# Patient Record
Sex: Female | Born: 1995 | Race: Black or African American | Hispanic: No | Marital: Single | Smoking: Never smoker
Health system: Southern US, Community
[De-identification: ages and names within clinical notes are randomized; demographics above are authoritative.]

## PROBLEM LIST (undated history)

## (undated) DIAGNOSIS — J42 Unspecified chronic bronchitis: Secondary | ICD-10-CM

## (undated) DIAGNOSIS — J45909 Unspecified asthma, uncomplicated: Secondary | ICD-10-CM

## (undated) DIAGNOSIS — J3089 Other allergic rhinitis: Secondary | ICD-10-CM

## (undated) HISTORY — DX: Other allergic rhinitis: J30.89

---
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
## 2015-01-22 ENCOUNTER — Encounter (HOSPITAL_COMMUNITY): Payer: Self-pay | Admitting: *Deleted

## 2015-01-22 ENCOUNTER — Emergency Department (HOSPITAL_COMMUNITY)
Admission: EM | Admit: 2015-01-22 | Discharge: 2015-01-22 | Disposition: A | Discharge status: Home / Self Care | Payer: Medicaid Other | Attending: Emergency Medicine | Admitting: Emergency Medicine

## 2015-01-22 DIAGNOSIS — R112 Nausea with vomiting, unspecified: Secondary | ICD-10-CM | POA: Insufficient documentation

## 2015-01-22 DIAGNOSIS — Z7951 Long term (current) use of inhaled steroids: Secondary | ICD-10-CM | POA: Diagnosis not present

## 2015-01-22 DIAGNOSIS — Z880 Allergy status to penicillin: Secondary | ICD-10-CM | POA: Insufficient documentation

## 2015-01-22 DIAGNOSIS — A599 Trichomoniasis, unspecified: Secondary | ICD-10-CM | POA: Diagnosis not present

## 2015-01-22 DIAGNOSIS — Z79899 Other long term (current) drug therapy: Secondary | ICD-10-CM | POA: Insufficient documentation

## 2015-01-22 DIAGNOSIS — Z3202 Encounter for pregnancy test, result negative: Secondary | ICD-10-CM | POA: Diagnosis not present

## 2015-01-22 DIAGNOSIS — J45909 Unspecified asthma, uncomplicated: Secondary | ICD-10-CM | POA: Insufficient documentation

## 2015-01-22 DIAGNOSIS — R079 Chest pain, unspecified: Secondary | ICD-10-CM | POA: Insufficient documentation

## 2015-01-22 DIAGNOSIS — R05 Cough: Secondary | ICD-10-CM | POA: Diagnosis present

## 2015-01-22 HISTORY — DX: Unspecified chronic bronchitis: J42

## 2015-01-22 LAB — CBC WITH DIFFERENTIAL/PLATELET
Basophils Absolute: 0 10*3/uL (ref 0.0–0.1)
Basophils Relative: 0 % (ref 0–1)
Eosinophils Absolute: 0.1 10*3/uL (ref 0.0–0.7)
Eosinophils Relative: 1 % (ref 0–5)
HEMATOCRIT: 31.1 % — AB (ref 36.0–46.0)
Hemoglobin: 9.7 g/dL — ABNORMAL LOW (ref 12.0–15.0)
LYMPHS ABS: 4.5 10*3/uL — AB (ref 0.7–4.0)
Lymphocytes Relative: 44 % (ref 12–46)
MCH: 23 pg — AB (ref 26.0–34.0)
MCHC: 31.2 g/dL (ref 30.0–36.0)
MCV: 73.7 fL — ABNORMAL LOW (ref 78.0–100.0)
Monocytes Absolute: 1 10*3/uL (ref 0.1–1.0)
Monocytes Relative: 10 % (ref 3–12)
NEUTROS ABS: 4.6 10*3/uL (ref 1.7–7.7)
NEUTROS PCT: 45 % (ref 43–77)
Platelets: 294 10*3/uL (ref 150–400)
RBC: 4.22 MIL/uL (ref 3.87–5.11)
RDW: 19 % — ABNORMAL HIGH (ref 11.5–15.5)
WBC: 10.2 10*3/uL (ref 4.0–10.5)

## 2015-01-22 LAB — COMPREHENSIVE METABOLIC PANEL
ALBUMIN: 3 g/dL — AB (ref 3.5–5.2)
ALT: 10 U/L (ref 0–35)
ANION GAP: 7 (ref 5–15)
AST: 18 U/L (ref 0–37)
Alkaline Phosphatase: 74 U/L (ref 39–117)
BUN: 10 mg/dL (ref 6–23)
CHLORIDE: 107 mmol/L (ref 96–112)
CO2: 26 mmol/L (ref 19–32)
CREATININE: 0.82 mg/dL (ref 0.50–1.10)
Calcium: 8.7 mg/dL (ref 8.4–10.5)
GFR calc non Af Amer: >90 mL/min (ref >90)
Glucose, Bld: 79 mg/dL (ref 70–99)
Potassium: 3.6 mmol/L (ref 3.5–5.1)
SODIUM: 140 mmol/L (ref 135–145)
Total Bilirubin: 0.3 mg/dL (ref 0.3–1.2)
Total Protein: 6.6 g/dL (ref 6.0–8.3)

## 2015-01-22 LAB — URINE MICROSCOPIC-ADD ON

## 2015-01-22 LAB — URINALYSIS, ROUTINE W REFLEX MICROSCOPIC
Bilirubin Urine: NEGATIVE
Glucose, UA: NEGATIVE mg/dL
Ketones, ur: NEGATIVE mg/dL
NITRITE: NEGATIVE
PROTEIN: NEGATIVE mg/dL
SPECIFIC GRAVITY, URINE: 1.028 (ref 1.005–1.030)
UROBILINOGEN UA: 0.2 mg/dL (ref 0.0–1.0)
pH: 7.5 (ref 5.0–8.0)

## 2015-01-22 LAB — POC URINE PREG, ED: PREG TEST UR: NEGATIVE

## 2015-01-22 MED ORDER — METRONIDAZOLE 500 MG PO TABS
2000.0000 mg | ORAL_TABLET | Freq: Once | ORAL | Status: AC
  Administered 2015-01-22: 2000 mg via ORAL
  Filled 2015-01-22: qty 4

## 2015-01-22 MED ORDER — ONDANSETRON 4 MG PO TBDP: 4.0000 mg | ORAL_TABLET | Freq: Three times a day (TID) | ORAL | Status: AC | PRN

## 2015-01-22 MED ORDER — ONDANSETRON 4 MG PO TBDP
4.0000 mg | ORAL_TABLET | Freq: Once | ORAL | Status: AC
  Administered 2015-01-22: 4 mg via ORAL
  Filled 2015-01-22: qty 1

## 2015-01-22 NOTE — ED Provider Notes (Addendum)
CSN: [id number]     Arrival date & time 01/22/15  1348 History   First MD Initiated Contact with Patient 01/22/15 1520     Chief Complaint  Patient presents with  . Cough  . Emesis  . Abdominal Pain     (Consider location/radiation/quality/duration/timing/severity/associated sxs/prior Treatment) Patient is a 19 y.o. female presenting with cough, vomiting, and abdominal pain. The history is provided by the patient.  Cough Associated symptoms: chest pain and shortness of breath   Associated symptoms: no fever, no headaches and no rash   Emesis Associated symptoms: abdominal pain   Associated symptoms: no headaches   Abdominal Pain Associated symptoms: chest pain, cough, dysuria, nausea, shortness of breath and vomiting   Associated symptoms: no fever    Patient with several complaints. Patient has a history of asthma and seasonal allergies. Recently seen by her allergist also a chest x-ray 2 days ago which was negative. Patient's had chest pain anterior chest on and off for the past several weeks. Last evening patient became nauseated and had 4 episodes of vomiting today no vomiting of blood no diarrhea. Mild abdominal pain in the suprapubic area with some dysuria. Patient is mostly concerned about the chest pain and about the nausea and vomiting. Patient states that the belly pain is about 4 out of 10. Described as an ache nonradiating. The chest pain when present is more sharp in nature somewhat worse with coughing. Patient occasionally has coughed up some blood-streaked sputum.  Past Medical History  Diagnosis Date  . Asthma   . Chronic bronchitis    History reviewed. No pertinent past surgical history. No family history on file. History  Substance Use Topics  . Smoking status: Never Smoker   . Smokeless tobacco: Not on file  . Alcohol Use: No   OB History    No data available     Review of Systems  Constitutional: Negative for fever.  HENT: Positive for congestion.    Eyes: Negative for visual disturbance.  Respiratory: Positive for cough and shortness of breath.   Cardiovascular: Positive for chest pain.  Gastrointestinal: Positive for nausea, vomiting and abdominal pain.  Genitourinary: Positive for dysuria.  Musculoskeletal: Negative for back pain.  Skin: Negative for rash.  Neurological: Negative for headaches.  Hematological: Does not bruise/bleed easily.  Psychiatric/Behavioral: Negative for confusion.      Allergies  Amoxicillin and Penicillins  Home Medications   Prior to Admission medications   Medication Sig Start Date End Date Taking? Authorizing Provider  albuterol (PROVENTIL HFA;VENTOLIN HFA) 108 (90 BASE) MCG/ACT inhaler Inhale into the lungs every 6 (six) hours as needed for wheezing or shortness of breath.    Historical Provider, MD  albuterol (PROVENTIL) (2.5 MG/3ML) 0.083% nebulizer solution Take 3 mLs (2.5 mg total) by nebulization every 6 (six) hours as needed for wheezing or shortness of breath. 06/10/14   Earley FavorGail Schulz, NP  beclomethasone (QVAR) 80 MCG/ACT inhaler Inhale 2 puffs into the lungs 2 (two) times daily.    Historical Provider, MD  cetirizine (ZYRTEC) 10 MG tablet Take 10 mg by mouth daily.    Historical Provider, MD  ibuprofen (ADVIL,MOTRIN) 600 MG tablet Take 1 tablet (600 mg total) by mouth every 8 (eight) hours as needed. 06/10/14   Earley FavorGail Schulz, NP  methocarbamol (ROBAXIN) 750 MG tablet Take 1 tablet (750 mg total) by mouth 3 (three) times daily. 06/10/14   Earley FavorGail Schulz, NP  montelukast (SINGULAIR) 10 MG tablet Take 10 mg by mouth at bedtime.  Historical Provider, MD   BP 122/84 mmHg  Pulse 71  Temp(Src) 98 F (36.7 C) (Oral)  Resp 30  Ht  (1.676 m)  Wt 200 lb (90.719 kg)  BMI 32.30 kg/m2  SpO2 100%  LMP 01/15/2015 Physical Exam  Constitutional: She is oriented to person, place, and time. She appears well-developed and well-nourished. No distress.  HENT:  Head: Normocephalic and atraumatic.  Eyes:  Conjunctivae and EOM are normal. Pupils are equal, round, and reactive to light.  Neck: Normal range of motion. Neck supple.  Cardiovascular: Normal rate, regular rhythm and normal heart sounds.   Pulmonary/Chest: Effort normal and breath sounds normal. No respiratory distress.  Abdominal: Soft. Bowel sounds are normal. There is no tenderness.  Musculoskeletal: Normal range of motion.  Neurological: She is alert and oriented to person, place, and time. No cranial nerve deficit. She exhibits normal muscle tone. Coordination normal.  Skin: Skin is warm. No rash noted.  Nursing note and vitals reviewed.   ED Course  Procedures (including critical care time) Labs Review Labs Reviewed  CBC WITH DIFFERENTIAL/PLATELET - Abnormal; Notable for the following:    Hemoglobin 9.7 (*)    HCT 31.1 (*)    MCV 73.7 (*)    MCH 23.0 (*)    RDW 19.0 (*)    Lymphs Abs 4.5 (*)    All other components within normal limits  COMPREHENSIVE METABOLIC PANEL - Abnormal; Notable for the following:    Albumin 3.0 (*)    All other components within normal limits  URINALYSIS, ROUTINE W REFLEX MICROSCOPIC  POC URINE PREG, ED   Results for orders placed or performed during the hospital encounter of 01/22/15  CBC with Differential  Result Value Ref Range   WBC 10.2 4.0 - 10.5 K/uL   RBC 4.22 3.87 - 5.11 MIL/uL   Hemoglobin 9.7 (L) 12.0 - 15.0 g/dL   HCT 16.1 (L) 09.6 - 04.5 %   MCV 73.7 (L) 78.0 - 100.0 fL   MCH 23.0 (L) 26.0 - 34.0 pg   MCHC 31.2 30.0 - 36.0 g/dL   RDW 40.9 (H) 81.1 - 91.4 %   Platelets 294 150 - 400 K/uL   Neutrophils Relative % 45 43 - 77 %   Neutro Abs 4.6 1.7 - 7.7 K/uL   Lymphocytes Relative 44 12 - 46 %   Lymphs Abs 4.5 (H) 0.7 - 4.0 K/uL   Monocytes Relative 10 3 - 12 %   Monocytes Absolute 1.0 0.1 - 1.0 K/uL   Eosinophils Relative 1 0 - 5 %   Eosinophils Absolute 0.1 0.0 - 0.7 K/uL   Basophils Relative 0 0 - 1 %   Basophils Absolute 0.0 0.0 - 0.1 K/uL  Comprehensive metabolic  panel  Result Value Ref Range   Sodium 140 135 - 145 mmol/L   Potassium 3.6 3.5 - 5.1 mmol/L   Chloride 107 96 - 112 mmol/L   CO2 26 19 - 32 mmol/L   Glucose, Bld 79 70 - 99 mg/dL   BUN 10 6 - 23 mg/dL   Creatinine, Ser 7.82 0.50 - 1.10 mg/dL   Calcium 8.7 8.4 - 95.6 mg/dL   Total Protein 6.6 6.0 - 8.3 g/dL   Albumin 3.0 (L) 3.5 - 5.2 g/dL   AST 18 0 - 37 U/L   ALT 10 0 - 35 U/L   Alkaline Phosphatase 74 39 - 117 U/L   Total Bilirubin 0.3 0.3 - 1.2 mg/dL   GFR calc non  Af Amer >90 >90 mL/min   GFR calc Af Amer >90 >90 mL/min   Anion gap 7 5 - 15  POC Urine Pregnancy, ED  (If Pre-menopausal female) - do not order at [facility]  Result Value Ref Range   Preg Test, Ur NEGATIVE NEGATIVE     Imaging Review No results found.   EKG Interpretation None     ED ECG REPORT   Date: 01/22/2015  Rate: 76  Rhythm: normal sinus rhythm  QRS Axis: normal  Intervals: normal  ST/T Wave abnormalities: normal  Conduction Disutrbances:none  Narrative Interpretation:   Old EKG Reviewed: none available  I have personally reviewed the EKG tracing and agree with the computerized printout as noted.  Patient's EKG is reasonable in  use: Will not cross over into Epic.  MDM   Final diagnoses:  Nausea and vomiting, vomiting of unspecified type  Chest pain, unspecified chest pain type    Patient with history of asthma and allergies followed closely by an allergist. Had chest x-ray done 2 days ago which was negative. Patient's had chest pain on and off for several days to weeks. EKG here is completely normal.  Patient's main concern was the nausea from last night and the vomiting today about 4 episodes. No blood in the vomit no diarrhea. Also with some lower quadrant abdominal discomfort and dysuria. Patient's labs are completely normal suspect the nausea and vomiting may be related to a viral gastritis. Urinalysis is still pending. However does see test is negative. No reason to repeat chest x-ray  since she had a -12 days ago. No asthma or wheezing currently ongoing.    Vanetta Mulders, MD 01/22/15 1551  If urinalysis is negative patient can be discharged home.  Vanetta Mulders, MD 01/22/15 1620

## 2015-01-22 NOTE — ED Provider Notes (Signed)
18:10- patient checked out to me to evaluate after return of UA, UA was done for evaluation of vomiting, abdominal pain and dysuria. UA returned showing trichomonas, urea, and bacteriuria. Urine culture ordered  At this time, the patient denies having dysuria. She is tolerating oral liquids. I discussed the finding of trichomonas in the urine and the possibility of a urinary tract infection with her. She does not want to have any further testing, for STDs. She is treated with Flagyl prior to discharge  Mancel BaleElliott Ilithyia Titzer, MD 01/22/15 [phone]

## 2015-01-22 NOTE — ED Notes (Signed)
Pt states she has asthma exacerbations.  This week she is coughing up blood and  She began vomiting today.  States chest pain last week and last night, but no now.

## 2015-01-22 NOTE — Discharge Instructions (Signed)
Start with a clear liquid diet, then gradually advance to regular foods, over 1-2 days time. Have your sexual partner checked and treated for trichomonas. Follow-up at the health department or the doctor of your choice for further screening for sexually transmitted diseases.    Nausea and Vomiting Nausea means you feel sick to your stomach. Throwing up (vomiting) is a reflex where stomach contents come out of your mouth. HOME CARE   Take medicine as told by your doctor.  Do not force yourself to eat. However, you do need to drink fluids.  If you feel like eating, eat a normal diet as told by your doctor.  Eat rice, wheat, potatoes, bread, lean meats, yogurt, fruits, and vegetables.  Avoid high-fat foods.  Drink enough fluids to keep your pee (urine) clear or pale yellow.  Ask your doctor how to replace body fluid losses (rehydrate). Signs of body fluid loss (dehydration) include:  Feeling very thirsty.  Dry lips and mouth.  Feeling dizzy.  Dark pee.  Peeing less than normal.  Feeling confused.  Fast breathing or heart rate. GET HELP RIGHT AWAY IF:   You have blood in your throw up.  You have black or bloody poop (stool).  You have a bad headache or stiff neck.  You feel confused.  You have bad belly (abdominal) pain.  You have chest pain or trouble breathing.  You do not pee at least once every 8 hours.  You have cold, clammy skin.  You keep throwing up after 24 to 48 hours.  You have a fever. MAKE SURE YOU:   Understand these instructions.  Will watch your condition.  Will get help right away if you are not doing well or get worse. Document Released: 03/14/2008 Document Revised: 12/19/2011 Document Reviewed: 02/25/2011 [facility] Patient Information 2015 [place], [place]. This information is not intended to replace advice given to you by your health care provider. Make sure you discuss any questions you have with your health care provider.  Sexually  Transmitted Disease A sexually transmitted disease (STD) is a disease or infection that may be passed (transmitted) from person to person, usually during sexual activity. This may happen by way of saliva, semen, blood, vaginal mucus, or urine. Common STDs include:   Gonorrhea.   Chlamydia.   Syphilis.   HIV and AIDS.   Genital herpes.   Hepatitis B and C.   Trichomonas.   Human papillomavirus (HPV).   Pubic lice.   Scabies.  Mites.  Bacterial vaginosis. WHAT ARE CAUSES OF STDs? An STD may be caused by bacteria, a virus, or parasites. STDs are often transmitted during sexual activity if one person is infected. However, they may also be transmitted through nonsexual means. STDs may be transmitted after:   Sexual intercourse with an infected person.   Sharing sex toys with an infected person.   Sharing needles with an infected person or using unclean piercing or tattoo needles.  Having intimate contact with the genitals, mouth, or rectal areas of an infected person.   Exposure to infected fluids during birth. WHAT ARE THE SIGNS AND SYMPTOMS OF STDs? Different STDs have different symptoms. Some people may not have any symptoms. If symptoms are present, they may include:   Painful or bloody urination.   Pain in the pelvis, abdomen, vagina, anus, throat, or eyes.   A skin rash, itching, or irritation.  Growths, ulcerations, blisters, or sores in the genital and anal areas.  Abnormal vaginal discharge with or without bad odor.  Penile discharge in men.   Fever.   Pain or bleeding during sexual intercourse.   Swollen glands in the groin area.   Yellow skin and eyes (jaundice). This is seen with hepatitis.   Swollen testicles.  Infertility.  Sores and blisters in the mouth. HOW ARE STDs DIAGNOSED? To make a diagnosis, your health care provider may:   Take a medical history.   Perform a physical exam.   Take a sample of any  discharge to examine.  Swab the throat, cervix, opening to the penis, rectum, or vagina for testing.  Test a sample of your first morning urine.   Perform blood tests.   Perform a Pap test, if this applies.   Perform a colposcopy.   Perform a laparoscopy.  HOW ARE STDs TREATED? Treatment depends on the STD. Some STDs may be treated but not cured.   Chlamydia, gonorrhea, trichomonas, and syphilis can be cured with antibiotic medicine.   Genital herpes, hepatitis, and HIV can be treated, but not cured, with prescribed medicines. The medicines lessen symptoms.   Genital warts from HPV can be treated with medicine or by freezing, burning (electrocautery), or surgery. Warts may come back.   HPV cannot be cured with medicine or surgery. However, abnormal areas may be removed from the cervix, vagina, or vulva.   If your diagnosis is confirmed, your recent sexual partners need treatment. This is true even if they are symptom-free or have a negative culture or evaluation. They should not have sex until their health care providers say it is okay. HOW CAN I REDUCE MY RISK OF GETTING AN STD? Take these steps to reduce your risk of getting an STD:  Use latex condoms, dental dams, and water-soluble lubricants during sexual activity. Do not use petroleum jelly or oils.  Avoid having multiple sex partners.  Do not have sex with someone who has other sex partners.  Do not have sex with anyone you do not know or who is at high risk for an STD.  Avoid risky sex practices that can break your skin.  Do not have sex if you have open sores on your mouth or skin.  Avoid drinking too much alcohol or taking illegal drugs. Alcohol and drugs can affect your judgment and put you in a vulnerable position.  Avoid engaging in oral and anal sex acts.  Get vaccinated for HPV and hepatitis. If you have not received these vaccines in the past, talk to your health care provider about whether one or  both might be right for you.   If you are at risk of being infected with HIV, it is recommended that you take a prescription medicine daily to prevent HIV infection. This is called pre-exposure prophylaxis (PrEP). You are considered at risk if:  You are a man who has sex with other men (MSM).  You are a heterosexual man or woman and are sexually active with more than one partner.  You take drugs by injection.  You are sexually active with a partner who has HIV.  Talk with your health care provider about whether you are at high risk of being infected with HIV. If you choose to begin PrEP, you should first be tested for HIV. You should then be tested every 3 months for as long as you are taking PrEP.  WHAT SHOULD I DO IF I THINK I HAVE AN STD?  See your health care provider.   Tell your sexual partner(s). They should be tested and treated  for any STDs.  Do not have sex until your health care provider says it is okay. WHEN SHOULD I GET IMMEDIATE MEDICAL CARE? Contact your health care provider right away if:   You have severe abdominal pain.  You are a man and notice swelling or pain in your testicles.  You are a woman and notice swelling or pain in your vagina. Document Released: 12/17/2002 Document Revised: 10/01/2013 Document Reviewed: 04/16/2013 [facility] Patient Information 2015 [place], [place]. This information is not intended to replace advice given to you by your health care provider. Make sure you discuss any questions you have with your health care provider.  Trichomoniasis Trichomoniasis is an infection caused by an organism called Trichomonas. The infection can affect both women and men. In women, the outer female genitalia and the vagina are affected. In men, the penis is mainly affected, but the prostate and other reproductive organs can also be involved. Trichomoniasis is a sexually transmitted infection (STI) and is most often passed to another person through sexual  contact.  RISK FACTORS  Having unprotected sexual intercourse.  Having sexual intercourse with an infected partner. SIGNS AND SYMPTOMS  Symptoms of trichomoniasis in women include:  Abnormal gray-green frothy vaginal discharge.  Itching and irritation of the vagina.  Itching and irritation of the area outside the vagina. Symptoms of trichomoniasis in men include:   Penile discharge with or without pain.  Pain during urination. This results from inflammation of the urethra. DIAGNOSIS  Trichomoniasis may be found during a Pap test or physical exam. Your health care provider may use one of the following methods to help diagnose this infection:  Examining vaginal discharge under a microscope. For men, urethral discharge would be examined.  Testing the pH of the vagina with a test tape.  Using a vaginal swab test that checks for the Trichomonas organism. A test is available that provides results within a few minutes.  Doing a culture test for the organism. This is not usually needed. TREATMENT   You may be given medicine to fight the infection. Women should inform their health care provider if they could be or are pregnant. Some medicines used to treat the infection should not be taken during pregnancy.  Your health care provider may recommend over-the-counter medicines or creams to decrease itching or irritation.  Your sexual partner will need to be treated if infected. HOME CARE INSTRUCTIONS   Take medicines only as directed by your health care provider.  Take over-the-counter medicine for itching or irritation as directed by your health care provider.  Do not have sexual intercourse while you have the infection.  Women should not douche or wear tampons while they have the infection.  Discuss your infection with your partner. Your partner may have gotten the infection from you, or you may have gotten it from your partner.  Have your sex partner get examined and treated if  necessary.  Practice safe, informed, and protected sex.  See your health care provider for other STI testing. SEEK MEDICAL CARE IF:   You still have symptoms after you finish your medicine.  You develop abdominal pain.  You have pain when you urinate.  You have bleeding after sexual intercourse.  You develop a rash.  Your medicine makes you sick or makes you throw up (vomit). MAKE SURE YOU:  Understand these instructions.  Will watch your condition.  Will get help right away if you are not doing well or get worse. Document Released: 03/22/2001 Document Revised: 02/10/2014 Document  Reviewed: 07/08/2013 [employer] Patient Information 2015 [place], [place]. This information is not intended to replace advice given to you by your health care provider. Make sure you discuss any questions you have with your health care provider.

## 2015-01-24 LAB — URINE CULTURE: Colony Count: 25000

## 2015-08-11 ENCOUNTER — Encounter: Payer: Self-pay | Admitting: Allergy and Immunology

## 2015-08-11 ENCOUNTER — Ambulatory Visit (INDEPENDENT_AMBULATORY_CARE_PROVIDER_SITE_OTHER): Payer: Medicaid Other | Admitting: Allergy and Immunology

## 2015-08-11 VITALS — BP 110/70 | HR 75 | Temp 97.6°F | Resp 16 | Ht 64.76 in | Wt 190.5 lb

## 2015-08-11 DIAGNOSIS — J3089 Other allergic rhinitis: Secondary | ICD-10-CM | POA: Diagnosis not present

## 2015-08-11 DIAGNOSIS — J45901 Unspecified asthma with (acute) exacerbation: Secondary | ICD-10-CM | POA: Insufficient documentation

## 2015-08-11 MED ORDER — PREDNISONE 1 MG PO TABS: 10.0000 mg | ORAL_TABLET | ORAL | Status: DC

## 2015-08-11 NOTE — Patient Instructions (Addendum)
Asthma with acute exacerbation  Prednisone has been provided, 40 mg x3 days, 20 mg x1 day, 10 mg x1 day, then stop.  We will assume administration of Nucala injections in this office.  Continue Symbicort 160/4.5, 2 inhalations via spacer device twice a day, Qvar 80 g, 2 inhalations via spacer device twice a day, montelukast 10 mg daily at bedtime, and Combivent respimat, every 4-6 hours as needed.   Karen Norton has been asked to contact me if her symptoms persist or progress. Otherwise, she may return for follow up in 3 months.  Allergic rhinitis  Karen Norton will resume aeroallergen immunotherapy injections under our care.  She has requested that we mix the vials here.  We will contact [facility] for vial contents.  Continue cetirizine and olopatadine nasal spray as needed.    Return in about 3 months (around 11/11/2015), or if symptoms worsen or fail to improve.

## 2015-08-11 NOTE — Progress Notes (Signed)
History of present illness: HPI Comments: Karen Norton is a 19 y.o. female with severe persistent asthma on Nucala and allergic rhinoconjunctivitis on aeroallergen immunotherapy who presents today for her initial evaluation.  She is [place] allerg[place] in [place] but is going to college in [place] and is unable to receive [place]uc[place] and [place] [facility] [place] [facility] [place].  She reports that over the past 2 or 3 weeks she has experienced increased coughing, chest tightness, and wheezing, especially at nighttime.  She has experienced nocturnal awakenings due to lower rest or symptoms every night over the past 2 or 3 weeks.  She has no nasal symptom complaints today.  She reports that nasal steroids caused epistaxis, therefore she currently uses a topical nasal antihistamine.   Assessment and plan: Asthma with acute exacerbation  Prednisone has been provided, 40 mg x3 days, 20 mg x1 day, 10 mg x1 day, then stop.  We will assume administration of Nucala injections in this office.  Continue Symbicort 160/4.5, 2 inhalations via spacer device twice a day, Qvar 80 g, 2 inhalations via spacer device twice a day, montelukast 10 mg daily at bedtime, and Combivent respimat, every 4-6 hours as needed.   Karen Norton has been asked to contact me if her symptoms persist or progress. Otherwise, she may return for follow up in 3 months.  Allergic rhinitis  Karen Norton will resume aeroallergen immunotherapy injections under our care.  She has requested that we mix the vials here.  We [facility] for vial contents.  Continue cetirizine and olopatadine nasal spray as needed.    Medications ordered this encounter: Prednisone: 40 mg x3 days, 20 mg x1 day, 10 mg x1 day, then stop.   Diagnositics: Spirometry: FVC is 2.74 L (86% predicted) and FEV1 is 2.20 L (78% predicted without significant postbronchodilator improvement.    Physical examination: Blood  pressure 110/70, pulse 75, temperature 97.6 F (36.4 C), temperature source Oral, resp. rate 16, height 5' 4.76" (1.645 m), weight 190 lb 7.6 oz (86.4 kg).  General: Alert, interactive, in no acute distress. HEENT: TMs pearly gray, turbinates moderately edematous without discharge, post-pharynx mildly erythematous. Neck: Supple without lymphadenopathy. Lungs: Clear to auscultation without wheezing, rhonchi or rales. CV: Normal S1, S2 without murmurs. Abdomen: Nondistended, nontender. Skin: Warm and dry, without lesions or rashes. Extremities:  No clubbing, cyanosis or edema. Neuro:   Grossly intact.  Review of systems: Review of Systems  Constitutional: Negative for fever, chills and weight loss.  HENT: Negative for nosebleeds.   Eyes: Negative for blurred vision.  Respiratory: Negative for hemoptysis.   Cardiovascular: Negative for chest pain.  Gastrointestinal: Negative for diarrhea and constipation.  Genitourinary: Negative for dysuria.  Musculoskeletal: Negative for myalgias and joint pain.  Neurological: Negative for dizziness.  Endo/Heme/Allergies: Does not bruise/bleed easily.    Past medical history: Past Medical History  Diagnosis Date  . Asthma   . Chronic bronchitis (HCC)   . Other allergic rhinitis     Past surgical history: None reported.  Family history: No significant or contributory family history has been reported.  Social history: Social History   Social History  . Marital Status: Single    Spouse Name: N/A  . Number of Children: N/A  . Years of Education: N/A   Occupational History  . Not on file.   Social History Main Topics  . Smoking status: Never Smoker   . Smokeless tobacco: Not on file  . Alcohol Use: No  .  Drug Use: No  . Sexual Activity: Not on file   Other Topics Concern  . Not on file   Social History Narrative   Environmental History:  Karen Norton lives in an apartment with carpeting throughout and central air/heat.  She is  a nonsmoker without pets.  Known medication allergies: Allergies  Allergen Reactions  . Amoxicillin     rash  . Penicillins     rash    Outpatient medications:   Medication List       This list is accurate as of: 08/11/15  4:45 PM.  Always use your most recent med list.               albuterol 108 (90 BASE) MCG/ACT inhaler  Commonly known as:  PROVENTIL HFA;VENTOLIN HFA  Inhale into the lungs every 6 (six) hours as needed for wheezing or shortness of breath.     albuterol (2.5 MG/3ML) 0.083% nebulizer solution  Commonly known as:  PROVENTIL  Take 3 mLs (2.5 mg total) by nebulization every 6 (six) hours as needed for wheezing or shortness of breath.     beclomethasone 80 MCG/ACT inhaler  Commonly known as:  QVAR  Inhale 2 puffs into the lungs 2 (two) times daily.     budesonide-formoterol 160-4.5 MCG/ACT inhaler  Commonly known as:  SYMBICORT  Inhale 2 puffs into the lungs 2 (two) times daily.     cetirizine 10 MG tablet  Commonly known as:  ZYRTEC  Take 10 mg by mouth daily.     COMBIVENT RESPIMAT 20-100 MCG/ACT Aers respimat  Generic drug:  Ipratropium-Albuterol  Inhale 1 puff into the lungs every 6 (six) hours as needed for wheezing.     fexofenadine 180 MG tablet  Commonly known as:  ALLEGRA  Take 180 mg by mouth daily.     ibuprofen 600 MG tablet  Commonly known as:  ADVIL,MOTRIN  Take 1 tablet (600 mg total) by mouth every 8 (eight) hours as needed.     methocarbamol 750 MG tablet  Commonly known as:  ROBAXIN  Take 1 tablet (750 mg total) by mouth 3 (three) times daily.     montelukast 10 MG tablet  Commonly known as:  SINGULAIR  Take 10 mg by mouth at bedtime.     ondansetron 4 MG disintegrating tablet  Commonly known as:  ZOFRAN ODT  Take 1 tablet (4 mg total) by mouth every 8 (eight) hours as needed for nausea or vomiting.     tranexamic acid 650 MG Tabs tablet  Commonly known as:  LYSTEDA  TAKE 2 TABLETS BY MOUTH 3 TIMES A DAY DURING MENSES  FOR MAX OF 5 DAYS        I appreciate the opportunity to take part in this Karen Norton's care. Please do not hesitate to contact me with questions.  Sincerely,   R. Jorene Guest, MD

## 2015-08-11 NOTE — Assessment & Plan Note (Signed)
   Karen Norton will resume aeroallergen immunotherapy injections under our care.  She has requested that we mix the vials here.  We [facility] for vial contents.  Continue cetirizine and olopatadine nasal spray as needed.

## 2015-08-11 NOTE — Assessment & Plan Note (Addendum)
   Prednisone has been provided, 40 mg x3 days, 20 mg x1 day, 10 mg x1 day, then stop.  We will assume administration of Nucala injections in this office.  Continue Symbicort 160/4.5, 2 inhalations via spacer device twice a day, Qvar 80 g, 2 inhalations via spacer device twice a day, montelukast 10 mg daily at bedtime, and Combivent respimat, every 4-6 hours as needed.   Karen Norton has been asked to contact me if her symptoms persist or progress. Otherwise, she may return for follow up in 3 months.

## 2015-10-20 ENCOUNTER — Telehealth: Payer: Self-pay | Admitting: *Deleted

## 2015-10-20 ENCOUNTER — Ambulatory Visit (INDEPENDENT_AMBULATORY_CARE_PROVIDER_SITE_OTHER): Payer: Medicaid Other | Admitting: *Deleted

## 2015-10-20 DIAGNOSIS — J4550 Severe persistent asthma, uncomplicated: Secondary | ICD-10-CM

## 2015-10-20 MED ORDER — MEPOLIZUMAB 100 MG ~~LOC~~ SOLR
100.0000 mg | SUBCUTANEOUS | Status: AC
  Administered 2015-10-20 – 2016-03-17 (×4): 100 mg via SUBCUTANEOUS

## 2015-10-20 NOTE — Telephone Encounter (Signed)
Have I submitted immunotherapy orders yet?  If not, please place her chart and pertinent immunotherapy information on my desk.  Thanks.

## 2015-10-20 NOTE — Telephone Encounter (Signed)
Place records in Dr. Nunzio CobbsBobbitt door. [facility] Thank you

## 2015-10-20 NOTE — Telephone Encounter (Signed)
Patient came in and received Nucala injections and signed consent for [facility] to fax skin test and immunotherapy ingredients. [facility].

## 2015-10-20 NOTE — Telephone Encounter (Signed)
Patient called and states she finished her allergy injections in [place] and is ready for us to start making her vials. Schedule appt for next Wednesday Jan 18 @ 4. Please advise.

## 2015-10-21 NOTE — Addendum Note (Signed)
Addended by: Candis SchatzBOBBITT, Lindel Marcell C on: 10/21/2015 06:02 PM   Modules accepted: Orders

## 2015-10-21 NOTE — Telephone Encounter (Signed)
Immunotherapy orders have been written. She received her last maintenance dose injection on Oct 05, 2015. Please mix RED vials. Patient is to start at 0.05cc of RED vial and increase by schedule A up to 0.5cc, then she may resume maintenance. Please contact me if you have any questions about this.  Thanks.

## 2015-10-23 DIAGNOSIS — J301 Allergic rhinitis due to pollen: Secondary | ICD-10-CM | POA: Diagnosis not present

## 2015-10-26 DIAGNOSIS — J302 Other seasonal allergic rhinitis: Secondary | ICD-10-CM | POA: Diagnosis not present

## 2015-10-28 ENCOUNTER — Ambulatory Visit (INDEPENDENT_AMBULATORY_CARE_PROVIDER_SITE_OTHER): Payer: Medicaid Other | Admitting: Neurology

## 2015-10-28 DIAGNOSIS — J309 Allergic rhinitis, unspecified: Secondary | ICD-10-CM | POA: Diagnosis not present

## 2015-10-28 NOTE — Progress Notes (Signed)
Immunotherapy   Patient Details  Name: Toshiko Kemler MRN: [id number] Date of Birth: Jun 07, 1996  10/28/2015  Betzy [place]  Following schedule: A ; ONCE PATIENT REACHES 0.5 PER DR BOBBITT WILL BE ON SCHEDULE C AND BE EVERY 2 WEEKS  Frequency: WEEKLY Epi-Pen: PATIENT HAS EPI PEN AND COPY OF INJECTION INFORMATION ALONG WITH INJECTION ROOM HOURS GIVEN. PATIENT ALSO SIGNED CONSENT FORM.     Nestor Lewandowsky 10/28/2015, 4:47 PM

## 2015-11-04 ENCOUNTER — Ambulatory Visit (INDEPENDENT_AMBULATORY_CARE_PROVIDER_SITE_OTHER): Payer: Medicaid Other

## 2015-11-04 DIAGNOSIS — J309 Allergic rhinitis, unspecified: Secondary | ICD-10-CM | POA: Diagnosis not present

## 2015-11-10 ENCOUNTER — Ambulatory Visit (INDEPENDENT_AMBULATORY_CARE_PROVIDER_SITE_OTHER): Payer: Medicaid Other | Admitting: *Deleted

## 2015-11-10 DIAGNOSIS — J309 Allergic rhinitis, unspecified: Secondary | ICD-10-CM

## 2015-11-26 ENCOUNTER — Ambulatory Visit (INDEPENDENT_AMBULATORY_CARE_PROVIDER_SITE_OTHER): Payer: Medicaid Other

## 2015-11-26 DIAGNOSIS — J309 Allergic rhinitis, unspecified: Secondary | ICD-10-CM | POA: Diagnosis not present

## 2015-11-26 NOTE — Progress Notes (Signed)
This encounter was created in error - please disregard.

## 2015-11-30 ENCOUNTER — Ambulatory Visit: Payer: Medicaid Other | Admitting: Allergy and Immunology

## 2015-12-01 ENCOUNTER — Encounter: Payer: Self-pay | Admitting: Allergy and Immunology

## 2015-12-01 ENCOUNTER — Ambulatory Visit: Payer: Self-pay

## 2015-12-01 ENCOUNTER — Ambulatory Visit (INDEPENDENT_AMBULATORY_CARE_PROVIDER_SITE_OTHER): Payer: Medicaid Other | Admitting: Allergy and Immunology

## 2015-12-01 VITALS — BP 102/80 | HR 76 | Temp 98.0°F | Resp 16 | Ht 64.57 in | Wt 184.1 lb

## 2015-12-01 DIAGNOSIS — J3089 Other allergic rhinitis: Secondary | ICD-10-CM

## 2015-12-01 DIAGNOSIS — J4550 Severe persistent asthma, uncomplicated: Secondary | ICD-10-CM

## 2015-12-01 MED ORDER — MOMETASONE FURO-FORMOTEROL FUM 200-5 MCG/ACT IN AERO: 2.0000 | INHALATION_SPRAY | Freq: Two times a day (BID) | RESPIRATORY_TRACT | Status: AC

## 2015-12-01 NOTE — Assessment & Plan Note (Signed)
   Continue appropriate allergen avoidance measures, aeroallergen immunotherapy as prescribed and as tolerated, montelukast 10 mg daily at bedtime, and cetirizine 10 mg daily as needed.

## 2015-12-01 NOTE — Patient Instructions (Addendum)
Severe persistent asthma  A prescription has been provided for [facility] (mometasone/formoterol) 200/5 g, 2 inhalations via spacer device twice a day.  Discontinue Symbicort.  Continue mepolizomab injections, Qvar 80, and montelukast 10 mg daily at bedtime.  A refill prescription has been provided for Combivent Respimat every 4-6 hours as needed.  Allergic rhinitis  Continue appropriate allergen avoidance measures, aeroallergen immunotherapy as prescribed and as tolerated, montelukast 10 mg daily at bedtime, and cetirizine 10 mg daily as needed.    Return in about 2 months (around 01/29/2016), or if symptoms worsen or fail to improve.

## 2015-12-01 NOTE — Progress Notes (Signed)
Follow-up Note  RE: Karen Norton MRN: [id number] DOB: [birth year] Date of Office Visit: 12/01/2015  Primary care provider: Elza Rafter, PA Referring provider: No ref. provider found  History of present illness: HPI Comments: Karen Norton is a 20 y.o. female with persistent asthma on mepolizomab and allergic rhinitis who presents today for follow up.  She was last seen in this office on 08/11/2015.  She reports that she still experiences dyspnea and wheezing with 2 flights of stairs despite compliance with mepolizomab injections, Symbicort 160/4.5, Qvar 80 g, and montelukast.  She experiences rest symptoms when she is outdoors with pollen exposure.  She needs a refill prescription of Combivent Respimat. She denies nocturnal awakenings due to lower respiratory symptoms.  She is tolerating mepolizomab injections and immunotherapy injections without competitions.  She has no nasal symptom complaints today.   Assessment and plan: Severe persistent asthma  A prescription has been provided for [facility] (mometasone/formoterol) 200/5 g, 2 inhalations via spacer device twice a day.  Discontinue Symbicort.  Continue mepolizomab injections, Qvar 80, and montelukast 10 mg daily at bedtime.  A refill prescription has been provided for Combivent Respimat every 4-6 hours as needed.  Allergic rhinitis  Continue appropriate allergen avoidance measures, aeroallergen immunotherapy as prescribed and as tolerated, montelukast 10 mg daily at bedtime, and cetirizine 10 mg daily as needed.    Meds ordered this encounter  Medications  . mometasone-formoterol (DULERA) 200-5 MCG/ACT AERO    Sig: Inhale 2 puffs into the lungs 2 (two) times daily.    Dispense:  8.8 g    Refill:  3    Diagnositics: Spirometry reveals FVC of 2.73 L and an FEV1 of 2.23 L (67% predicted) with significant (270 mL, 12%) post-bronchial dilator improvement.    Physical examination: Blood pressure 102/80, pulse  76, temperature 98 F (36.7 C), resp. rate 16, height 5' 4.57" (1.64 m), weight 184 lb 1.4 oz (83.5 kg).  General: Alert, interactive, in no acute distress. HEENT: TMs pearly gray, turbinates moderately edematous without discharge, post-pharynx mildly erythematous. Neck: Supple without lymphadenopathy. Lungs: Mildly decreased breath sounds bilaterally without wheezing, rhonchi or rales. CV: Normal S1, S2 without murmurs. Skin: Warm and dry, without lesions or rashes.  The following portions of the patient's history were reviewed and updated as appropriate: allergies, current medications, past family history, past medical history, past social history, past surgical history and problem list.    Medication List       This list is accurate as of: 12/01/15  1:22 PM.  Always use your most recent med list.               albuterol 108 (90 Base) MCG/ACT inhaler  Commonly known as:  PROVENTIL HFA;VENTOLIN HFA  Inhale into the lungs every 6 (six) hours as needed for wheezing or shortness of breath.     albuterol (2.5 MG/3ML) 0.083% nebulizer solution  Commonly known as:  PROVENTIL  Take 3 mLs (2.5 mg total) by nebulization every 6 (six) hours as needed for wheezing or shortness of breath.     beclomethasone 80 MCG/ACT inhaler  Commonly known as:  QVAR  Inhale 2 puffs into the lungs 2 (two) times daily.     budesonide-formoterol 160-4.5 MCG/ACT inhaler  Commonly known as:  SYMBICORT  Inhale 2 puffs into the lungs 2 (two) times daily.     cetirizine 10 MG tablet  Commonly known as:  ZYRTEC  Take 10 mg by mouth daily.     COMBIVENT RESPIMAT  20-100 MCG/ACT Aers respimat  Generic drug:  Ipratropium-Albuterol  Inhale 1 puff into the lungs every 6 (six) hours as needed for wheezing.     fexofenadine 180 MG tablet  Commonly known as:  ALLEGRA  Take 180 mg by mouth daily.     ibuprofen 600 MG tablet  Commonly known as:  ADVIL,MOTRIN  Take 1 tablet (600 mg total) by mouth every 8  (eight) hours as needed.     methocarbamol 750 MG tablet  Commonly known as:  ROBAXIN  Take 1 tablet (750 mg total) by mouth 3 (three) times daily.     mometasone-formoterol 200-5 MCG/ACT Aero  Commonly known as:  DULERA  Inhale 2 puffs into the lungs 2 (two) times daily.     montelukast 10 MG tablet  Commonly known as:  SINGULAIR  Take 10 mg by mouth at bedtime.     ondansetron 4 MG disintegrating tablet  Commonly known as:  ZOFRAN ODT  Take 1 tablet (4 mg total) by mouth every 8 (eight) hours as needed for nausea or vomiting.     tranexamic acid 650 MG Tabs tablet  Commonly known as:  LYSTEDA  TAKE 2 TABLETS BY MOUTH 3 TIMES A DAY DURING MENSES FOR MAX OF 5 DAYS        Allergies  Allergen Reactions  . Amoxicillin     rash  . Penicillins     rash    I appreciate the opportunity to take part in this Karen Norton's care. Please do not hesitate to contact me with questions.  Sincerely,   R. Jorene Guest, MD

## 2015-12-01 NOTE — Assessment & Plan Note (Addendum)
   A prescription has been provided for [facility] (mometasone/formoterol) 200/5 g, 2 inhalations via spacer device twice a day.  Discontinue Symbicort.  Continue mepolizomab injections, Qvar 80, and montelukast 10 mg daily at bedtime.  A refill prescription has been provided for Combivent Respimat every 4-6 hours as needed.

## 2015-12-07 ENCOUNTER — Encounter: Payer: Self-pay | Admitting: *Deleted

## 2015-12-22 ENCOUNTER — Ambulatory Visit (INDEPENDENT_AMBULATORY_CARE_PROVIDER_SITE_OTHER): Payer: Medicaid Other

## 2015-12-22 DIAGNOSIS — J309 Allergic rhinitis, unspecified: Secondary | ICD-10-CM

## 2016-01-01 IMAGING — CR DG CHEST 2V
2 series · 2 of 2 positions shown · non-contrast
Comparison: None available

CLINICAL DATA: pain sob

EXAM:
CHEST - 2 VIEW

[w chest pa]
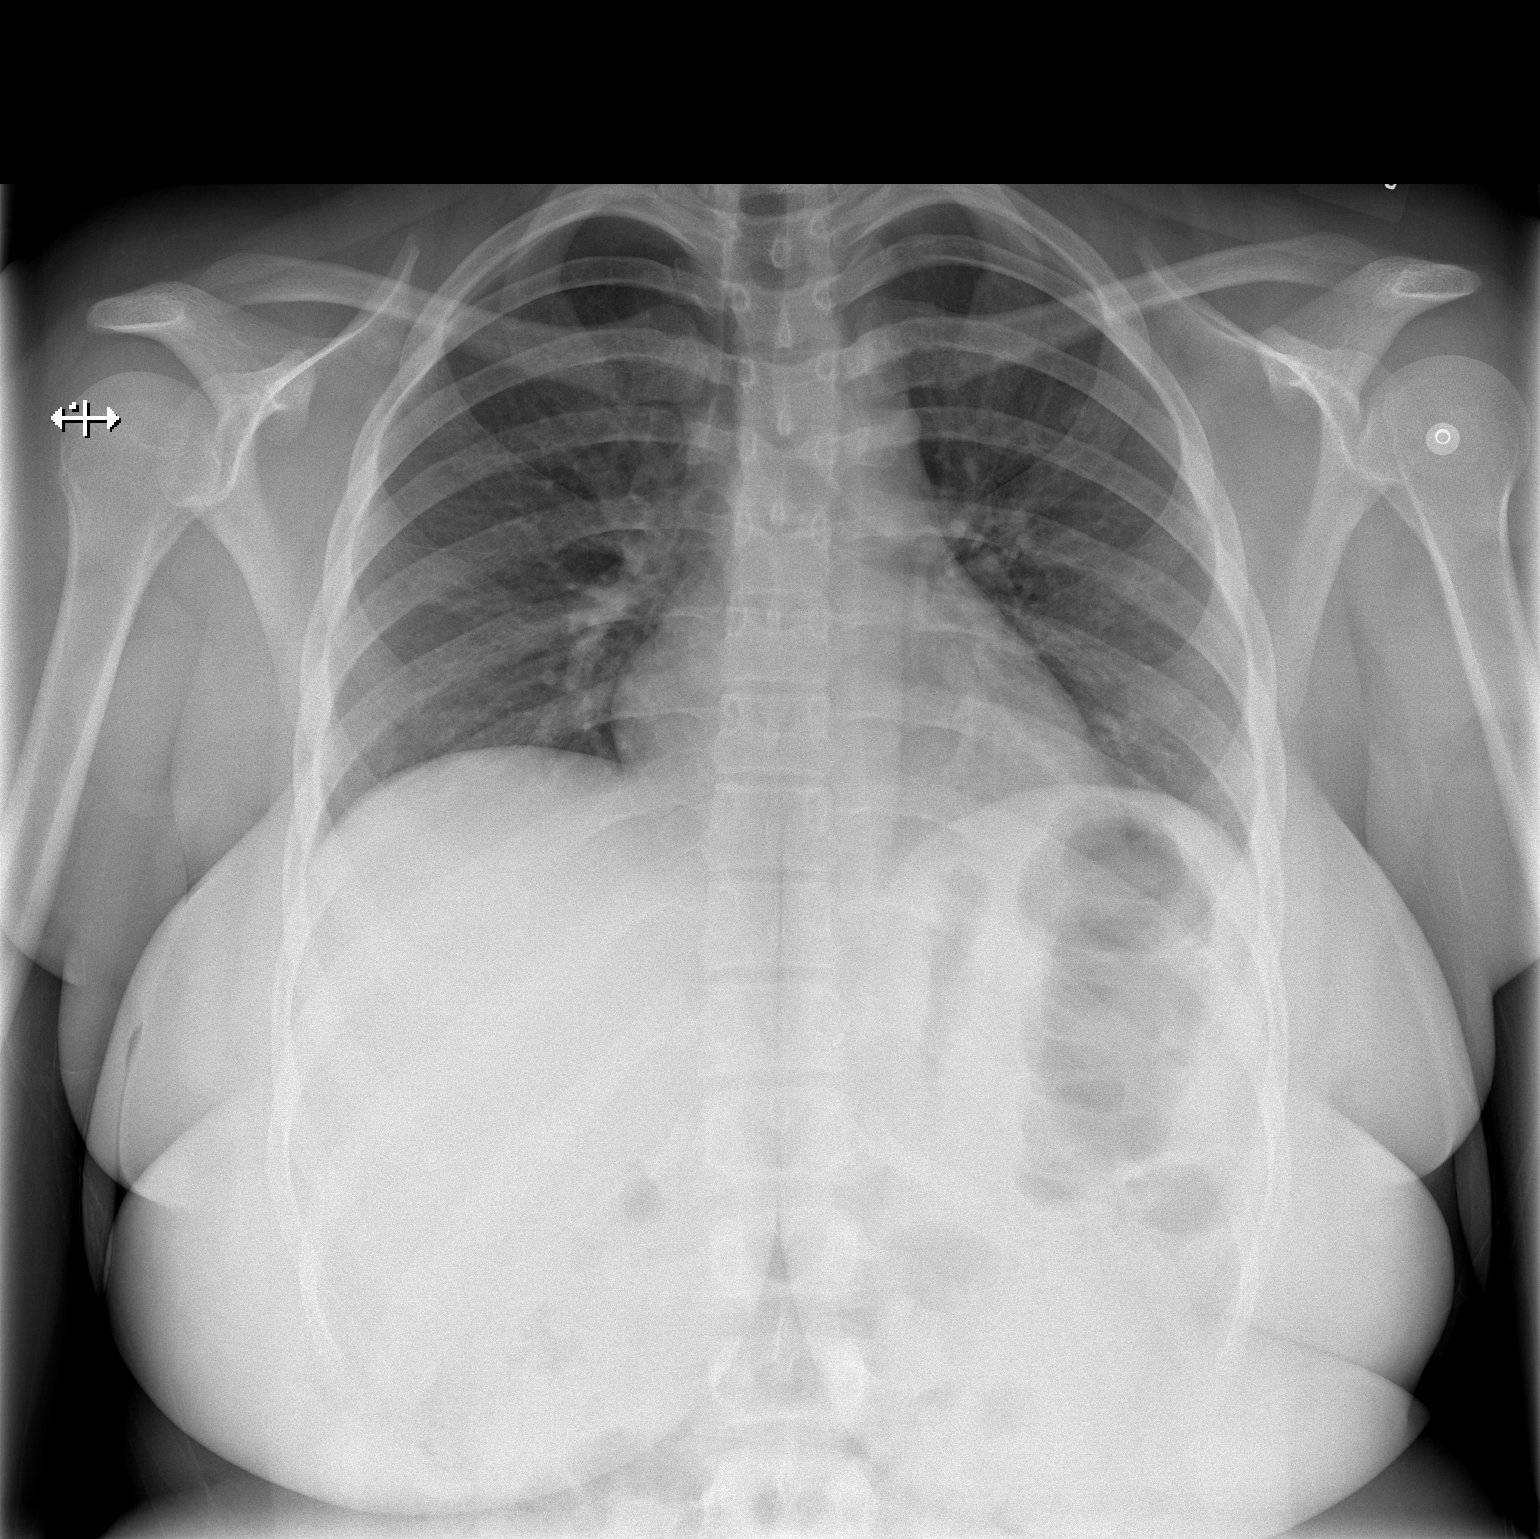

[w chest lat]
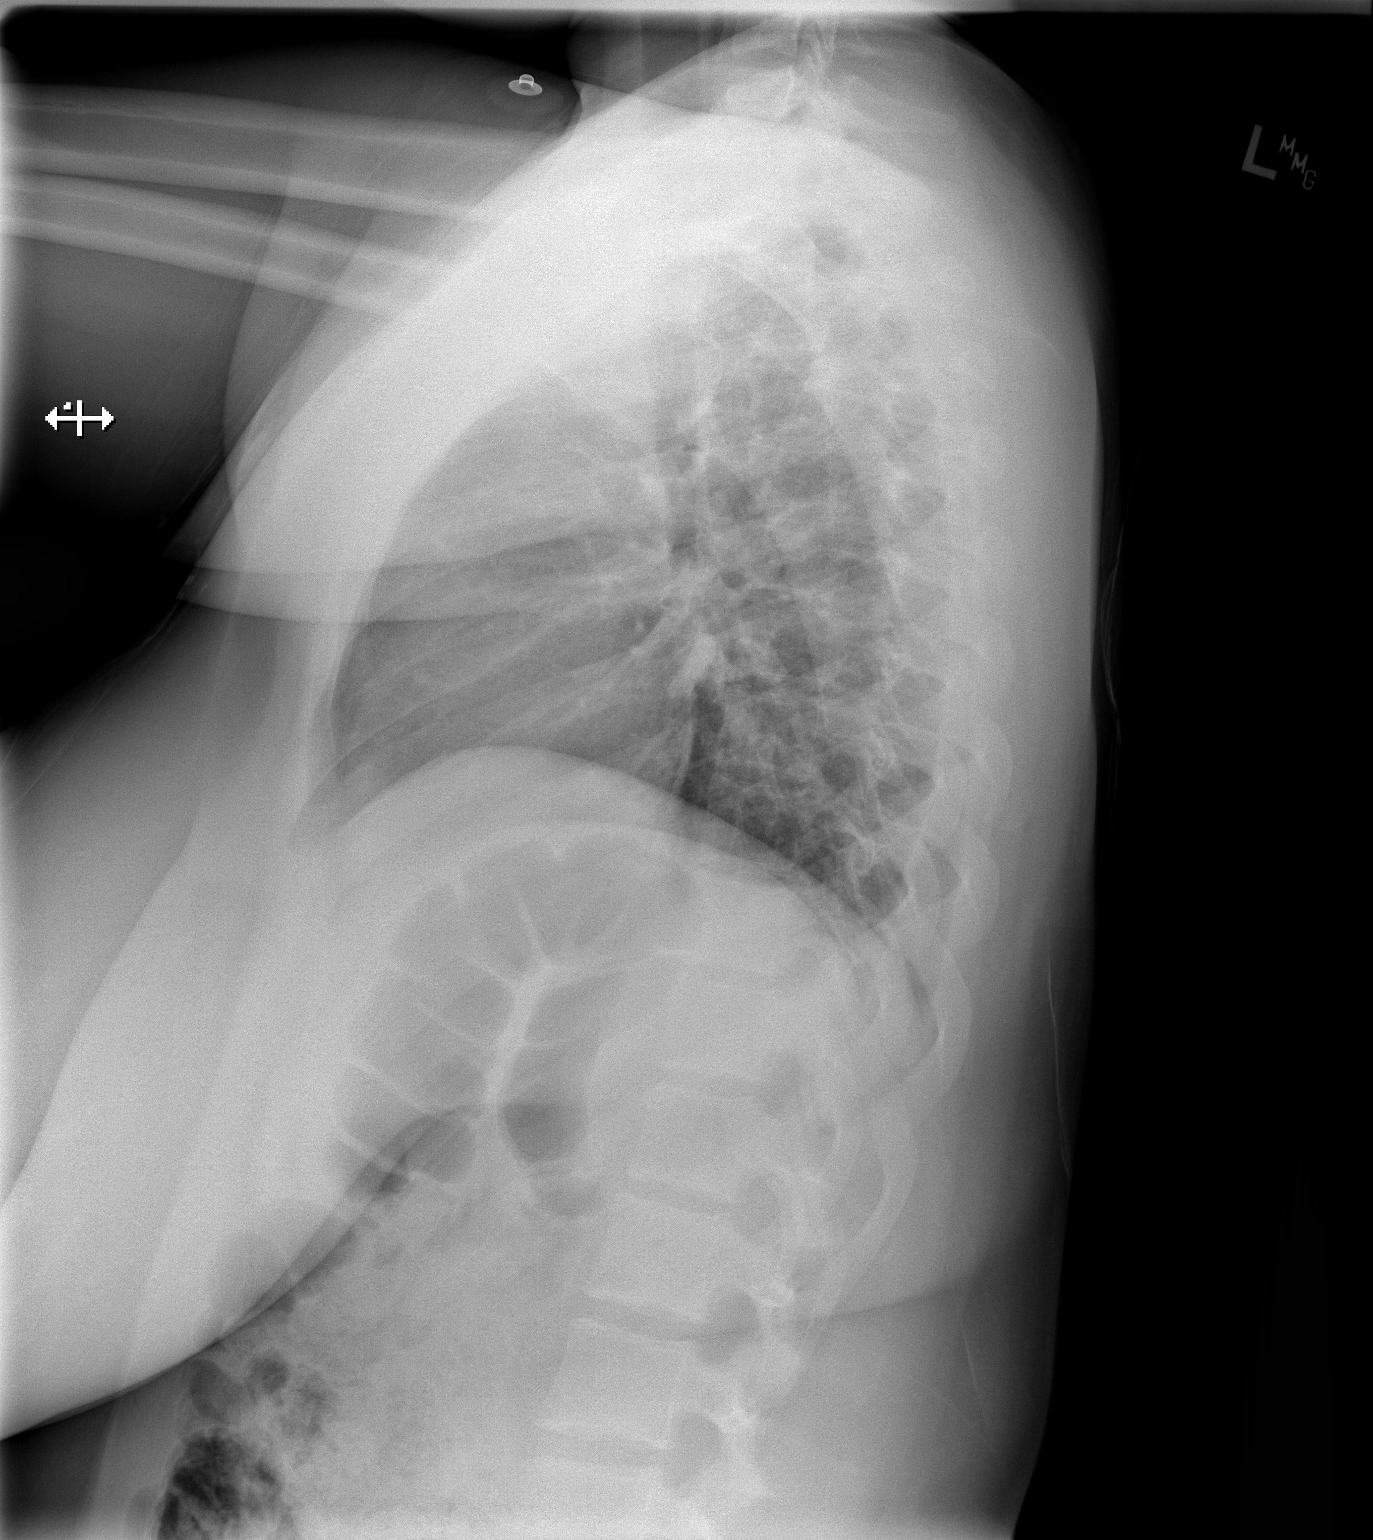

[2 of 2 positions shown; findings below may reference images not displayed]

FINDINGS: Low lung volumes. Lungs are clear. Heart size and mediastinal
contours are within normal limits.
No effusion.
Visualized skeletal structures are unremarkable.
IMPRESSION: No acute cardiopulmonary disease.

## 2016-01-05 ENCOUNTER — Telehealth: Payer: Self-pay

## 2016-01-05 ENCOUNTER — Ambulatory Visit (INDEPENDENT_AMBULATORY_CARE_PROVIDER_SITE_OTHER): Payer: Medicaid Other

## 2016-01-05 DIAGNOSIS — J309 Allergic rhinitis, unspecified: Secondary | ICD-10-CM | POA: Diagnosis not present

## 2016-01-05 NOTE — Telephone Encounter (Signed)
Please call in levocetirizine 5 mg daily as needed.  In addition, offer the patient prednisone 10 mg daily for 5 days.  Thanks.

## 2016-01-05 NOTE — Telephone Encounter (Signed)
Pt is have some stuffy nose, drainage in throat, watery eyes, some headache, pressure in ears, and is having lots of sneezing. What else can she do to help relieve all of these symptoms?

## 2016-01-06 ENCOUNTER — Telehealth: Payer: Self-pay

## 2016-01-06 ENCOUNTER — Other Ambulatory Visit: Payer: Self-pay

## 2016-01-06 MED ORDER — PREDNISONE 10 MG PO TABS: ORAL_TABLET | ORAL | Status: AC

## 2016-01-06 MED ORDER — LEVOCETIRIZINE DIHYDROCHLORIDE 5 MG PO TABS: 5.0000 mg | ORAL_TABLET | Freq: Every evening | ORAL | Status: AC

## 2016-01-06 NOTE — Telephone Encounter (Signed)
Sent in xyzal waiting on pt to call me back about prednisone pick up.

## 2016-01-06 NOTE — Telephone Encounter (Signed)
Patient agreeable to prednisone.  Sent to [facility] on [address] Patient would like to have a sample nose spray if we have it.  Can not recall which on she has had before and none on medication list    Please call in levocetirizine 5 mg daily as needed. In addition, offer the patient prednisone 10 mg daily for 5 days. Thanks.

## 2016-01-07 ENCOUNTER — Ambulatory Visit (INDEPENDENT_AMBULATORY_CARE_PROVIDER_SITE_OTHER): Payer: Medicaid Other

## 2016-01-07 DIAGNOSIS — J4550 Severe persistent asthma, uncomplicated: Secondary | ICD-10-CM | POA: Diagnosis not present

## 2016-01-12 ENCOUNTER — Ambulatory Visit (INDEPENDENT_AMBULATORY_CARE_PROVIDER_SITE_OTHER): Payer: Medicaid Other

## 2016-01-12 DIAGNOSIS — J309 Allergic rhinitis, unspecified: Secondary | ICD-10-CM

## 2016-01-19 ENCOUNTER — Ambulatory Visit (INDEPENDENT_AMBULATORY_CARE_PROVIDER_SITE_OTHER): Payer: Medicaid Other | Admitting: *Deleted

## 2016-01-19 DIAGNOSIS — J309 Allergic rhinitis, unspecified: Secondary | ICD-10-CM

## 2016-01-26 ENCOUNTER — Ambulatory Visit (INDEPENDENT_AMBULATORY_CARE_PROVIDER_SITE_OTHER): Payer: Medicaid Other

## 2016-01-26 DIAGNOSIS — J309 Allergic rhinitis, unspecified: Secondary | ICD-10-CM

## 2016-02-02 ENCOUNTER — Ambulatory Visit (INDEPENDENT_AMBULATORY_CARE_PROVIDER_SITE_OTHER): Payer: Medicaid Other

## 2016-02-02 DIAGNOSIS — J309 Allergic rhinitis, unspecified: Secondary | ICD-10-CM | POA: Diagnosis not present

## 2016-02-04 ENCOUNTER — Ambulatory Visit (INDEPENDENT_AMBULATORY_CARE_PROVIDER_SITE_OTHER): Payer: Medicaid Other

## 2016-02-04 DIAGNOSIS — J4550 Severe persistent asthma, uncomplicated: Secondary | ICD-10-CM

## 2016-03-08 ENCOUNTER — Ambulatory Visit (INDEPENDENT_AMBULATORY_CARE_PROVIDER_SITE_OTHER): Payer: Medicaid Other

## 2016-03-08 DIAGNOSIS — J309 Allergic rhinitis, unspecified: Secondary | ICD-10-CM

## 2016-03-17 ENCOUNTER — Ambulatory Visit (INDEPENDENT_AMBULATORY_CARE_PROVIDER_SITE_OTHER): Payer: Self-pay

## 2016-03-17 DIAGNOSIS — J4550 Severe persistent asthma, uncomplicated: Secondary | ICD-10-CM

## 2016-03-22 ENCOUNTER — Ambulatory Visit (INDEPENDENT_AMBULATORY_CARE_PROVIDER_SITE_OTHER): Payer: Self-pay | Admitting: *Deleted

## 2016-03-22 DIAGNOSIS — J309 Allergic rhinitis, unspecified: Secondary | ICD-10-CM

## 2016-03-29 DIAGNOSIS — J302 Other seasonal allergic rhinitis: Secondary | ICD-10-CM

## 2016-03-30 DIAGNOSIS — J301 Allergic rhinitis due to pollen: Secondary | ICD-10-CM

## 2016-03-31 ENCOUNTER — Ambulatory Visit (INDEPENDENT_AMBULATORY_CARE_PROVIDER_SITE_OTHER): Payer: Self-pay

## 2016-03-31 DIAGNOSIS — J309 Allergic rhinitis, unspecified: Secondary | ICD-10-CM

## 2016-04-20 ENCOUNTER — Encounter (HOSPITAL_COMMUNITY): Payer: Self-pay | Admitting: Nurse Practitioner

## 2016-04-20 ENCOUNTER — Ambulatory Visit (INDEPENDENT_AMBULATORY_CARE_PROVIDER_SITE_OTHER): Payer: Medicaid Other

## 2016-04-20 ENCOUNTER — Emergency Department (HOSPITAL_COMMUNITY)
Admission: EM | Admit: 2016-04-20 | Discharge: 2016-04-20 | Disposition: A | Discharge status: Home / Self Care | Payer: Medicaid Other | Attending: Emergency Medicine | Admitting: Emergency Medicine

## 2016-04-20 DIAGNOSIS — O26891 Other specified pregnancy related conditions, first trimester: Secondary | ICD-10-CM | POA: Diagnosis not present

## 2016-04-20 DIAGNOSIS — J309 Allergic rhinitis, unspecified: Secondary | ICD-10-CM | POA: Diagnosis not present

## 2016-04-20 DIAGNOSIS — Z3A11 11 weeks gestation of pregnancy: Secondary | ICD-10-CM | POA: Insufficient documentation

## 2016-04-20 DIAGNOSIS — R109 Unspecified abdominal pain: Secondary | ICD-10-CM

## 2016-04-20 DIAGNOSIS — R1030 Lower abdominal pain, unspecified: Secondary | ICD-10-CM | POA: Insufficient documentation

## 2016-04-20 DIAGNOSIS — Z3490 Encounter for supervision of normal pregnancy, unspecified, unspecified trimester: Secondary | ICD-10-CM

## 2016-04-20 DIAGNOSIS — R040 Epistaxis: Secondary | ICD-10-CM | POA: Diagnosis not present

## 2016-04-20 DIAGNOSIS — J45909 Unspecified asthma, uncomplicated: Secondary | ICD-10-CM | POA: Insufficient documentation

## 2016-04-20 DIAGNOSIS — O9989 Other specified diseases and conditions complicating pregnancy, childbirth and the puerperium: Secondary | ICD-10-CM | POA: Diagnosis not present

## 2016-04-20 DIAGNOSIS — Z79899 Other long term (current) drug therapy: Secondary | ICD-10-CM | POA: Diagnosis not present

## 2016-04-20 LAB — URINALYSIS, ROUTINE W REFLEX MICROSCOPIC
BILIRUBIN URINE: NEGATIVE
Glucose, UA: NEGATIVE mg/dL
Hgb urine dipstick: NEGATIVE
KETONES UR: 15 mg/dL — AB
Leukocytes, UA: NEGATIVE
NITRITE: NEGATIVE
PROTEIN: NEGATIVE mg/dL
SPECIFIC GRAVITY, URINE: 1.03 (ref 1.005–1.030)
pH: 6.5 (ref 5.0–8.0)

## 2016-04-20 LAB — CBC
HCT: 35.1 % — ABNORMAL LOW (ref 36.0–46.0)
HEMOGLOBIN: 11.2 g/dL — AB (ref 12.0–15.0)
MCH: 24 pg — ABNORMAL LOW (ref 26.0–34.0)
MCHC: 31.9 g/dL (ref 30.0–36.0)
MCV: 75.2 fL — ABNORMAL LOW (ref 78.0–100.0)
PLATELETS: 314 10*3/uL (ref 150–400)
RBC: 4.67 MIL/uL (ref 3.87–5.11)
RDW: 15.9 % — ABNORMAL HIGH (ref 11.5–15.5)
WBC: 7.9 10*3/uL (ref 4.0–10.5)

## 2016-04-20 LAB — I-STAT BETA HCG BLOOD, ED (MC, WL, AP ONLY)

## 2016-04-20 LAB — COMPREHENSIVE METABOLIC PANEL
ALK PHOS: 53 U/L (ref 38–126)
ALT: 9 U/L — ABNORMAL LOW (ref 14–54)
ANION GAP: 7 (ref 5–15)
AST: 16 U/L (ref 15–41)
Albumin: 3.3 g/dL — ABNORMAL LOW (ref 3.5–5.0)
BILIRUBIN TOTAL: 0.6 mg/dL (ref 0.3–1.2)
BUN: <5 mg/dL — ABNORMAL LOW (ref 6–20)
CALCIUM: 8.9 mg/dL (ref 8.9–10.3)
CO2: 20 mmol/L — ABNORMAL LOW (ref 22–32)
CREATININE: 0.57 mg/dL (ref 0.44–1.00)
Chloride: 105 mmol/L (ref 101–111)
GFR calc non Af Amer: >60 mL/min (ref >60)
GLUCOSE: 79 mg/dL (ref 65–99)
Potassium: 3.8 mmol/L (ref 3.5–5.1)
Sodium: 132 mmol/L — ABNORMAL LOW (ref 135–145)
TOTAL PROTEIN: 7 g/dL (ref 6.5–8.1)

## 2016-04-20 NOTE — Discharge Instructions (Signed)
You can take tylenol for pain. Return for worsening symptoms, including escalating pain, vaginal bleeding, fever, vomiting and unable to keep down food/fluids, or any other symptoms concerning to you.  Follow-up with your [facility] physician as scheduled.  First Trimester of Pregnancy The first trimester of pregnancy is from week 1 until the end of week 12 (months 1 through 3). A week after a sperm fertilizes an egg, the egg will implant on the wall of the uterus. This embryo will begin to develop into a baby. Genes from you and your partner are forming the baby. The female genes determine whether the baby is a boy or a girl. At 6-8 weeks, the eyes and face are formed, and the heartbeat can be seen on ultrasound. At the end of 12 weeks, all the baby's organs are formed.  Now that you are pregnant, you will want to do everything you can to have a healthy baby. Two of the most important things are to get good prenatal care and to follow your health care provider's instructions. Prenatal care is all the medical care you receive before the baby's birth. This care will help prevent, find, and treat any problems during the pregnancy and childbirth. BODY CHANGES Your body goes through many changes during pregnancy. The changes vary from woman to woman.   You may gain or lose a couple of pounds at first.  You may feel sick to your stomach (nauseous) and throw up (vomit). If the vomiting is uncontrollable, call your health care provider.  You may tire easily.  You may develop headaches that can be relieved by medicines approved by your health care provider.  You may urinate more often. Painful urination may mean you have a bladder infection.  You may develop heartburn as a result of your pregnancy.  You may develop constipation because certain hormones are causing the muscles that push waste through your intestines to slow down.  You may develop hemorrhoids or swollen, bulging veins (varicose veins).  Your  breasts may begin to grow larger and become tender. Your nipples may stick out more, and the tissue that surrounds them (areola) may become darker.  Your gums may bleed and may be sensitive to brushing and flossing.  Dark spots or blotches (chloasma, mask of pregnancy) may develop on your face. This will likely fade after the baby is born.  Your menstrual periods will stop.  You may have a loss of appetite.  You may develop cravings for certain kinds of food.  You may have changes in your emotions from day to day, such as being excited to be pregnant or being concerned that something may go wrong with the pregnancy and baby.  You may have more vivid and strange dreams.  You may have changes in your hair. These can include thickening of your hair, rapid growth, and changes in texture. Some women also have hair loss during or after pregnancy, or hair that feels dry or thin. Your hair will most likely return to normal after your baby is born. WHAT TO EXPECT AT YOUR PRENATAL VISITS During a routine prenatal visit:  You will be weighed to make sure you and the baby are growing normally.  Your blood pressure will be taken.  Your abdomen will be measured to track your baby's growth.  The fetal heartbeat will be listened to starting around week 10 or 12 of your pregnancy.  Test results from any previous visits will be discussed. Your health care provider may ask you:  How you are feeling.  If you are feeling the baby move.  If you have had any abnormal symptoms, such as leaking fluid, bleeding, severe headaches, or abdominal cramping.  If you are using any tobacco products, including cigarettes, chewing tobacco, and electronic cigarettes.  If you have any questions. Other tests that may be performed during your first trimester include:  Blood tests to find your blood type and to check for the presence of any previous infections. They will also be used to check for low iron levels  (anemia) and Rh antibodies. Later in the pregnancy, blood tests for diabetes will be done along with other tests if problems develop.  Urine tests to check for infections, diabetes, or protein in the urine.  An ultrasound to confirm the proper growth and development of the baby.  An amniocentesis to check for possible genetic problems.  Fetal screens for spina bifida and Down syndrome.  You may need other tests to make sure you and the baby are doing well.  HIV (human immunodeficiency virus) testing. Routine prenatal testing includes screening for HIV, unless you choose not to have this test. HOME CARE INSTRUCTIONS  Medicines  Follow your health care provider's instructions regarding medicine use. Specific medicines may be either safe or unsafe to take during pregnancy.  Take your prenatal vitamins as directed.  If you develop constipation, try taking a stool softener if your health care provider approves. Diet  Eat regular, well-balanced meals. Choose a variety of foods, such as meat or vegetable-based protein, fish, milk and low-fat dairy products, vegetables, fruits, and whole grain breads and cereals. Your health care provider will help you determine the amount of weight gain that is right for you.  Avoid raw meat and uncooked cheese. These carry germs that can cause birth defects in the baby.  Eating four or five small meals rather than three large meals a day may help relieve nausea and vomiting. If you start to feel nauseous, eating a few soda crackers can be helpful. Drinking liquids between meals instead of during meals also seems to help nausea and vomiting.  If you develop constipation, eat more high-fiber foods, such as fresh vegetables or fruit and whole grains. Drink enough fluids to keep your urine clear or pale yellow. Activity and Exercise  Exercise only as directed by your health care provider. Exercising will help you:  Control your weight.  Stay in shape.  Be  prepared for labor and delivery.  Experiencing pain or cramping in the lower abdomen or low back is a good sign that you should stop exercising. Check with your health care provider before continuing normal exercises.  Try to avoid standing for long periods of time. Move your legs often if you must stand in one place for a long time.  Avoid heavy lifting.  Wear low-heeled shoes, and practice good posture.  You may continue to have sex unless your health care provider directs you otherwise. Relief of Pain or Discomfort  Wear a good support bra for breast tenderness.   Take warm sitz baths to soothe any pain or discomfort caused by hemorrhoids. Use hemorrhoid cream if your health care provider approves.   Rest with your legs elevated if you have leg cramps or low back pain.  If you develop varicose veins in your legs, wear support hose. Elevate your feet for 15 minutes, 3-4 times a day. Limit salt in your diet. Prenatal Care  Schedule your prenatal visits by the twelfth week of pregnancy.  They are usually scheduled monthly at first, then more often in the last 2 months before delivery.  Write down your questions. Take them to your prenatal visits.  Keep all your prenatal visits as directed by your health care provider. Safety  Wear your seat belt at all times when driving.  Make a list of emergency phone numbers, including numbers for family, friends, the hospital, and police and fire departments. General Tips  Ask your health care provider for a referral to a local prenatal education class. Begin classes no later than at the beginning of month 6 of your pregnancy.  Ask for help if you have counseling or nutritional needs during pregnancy. Your health care provider can offer advice or refer you to specialists for help with various needs.  Do not use hot tubs, steam rooms, or saunas.  Do not douche or use tampons or scented sanitary pads.  Do not cross your legs for long  periods of time.  Avoid cat litter boxes and soil used by cats. These carry germs that can cause birth defects in the baby and possibly loss of the fetus by miscarriage or stillbirth.  Avoid all smoking, herbs, alcohol, and medicines not prescribed by your health care provider. Chemicals in these affect the formation and growth of the baby.  Do not use any tobacco products, including cigarettes, chewing tobacco, and electronic cigarettes. If you need help quitting, ask your health care provider. You may receive counseling support and other resources to help you quit.  Schedule a dentist appointment. At home, brush your teeth with a soft toothbrush and be gentle when you floss. SEEK MEDICAL CARE IF:   You have dizziness.  You have mild pelvic cramps, pelvic pressure, or nagging pain in the abdominal area.  You have persistent nausea, vomiting, or diarrhea.  You have a bad smelling vaginal discharge.  You have pain with urination.  You notice increased swelling in your face, hands, legs, or ankles. SEEK IMMEDIATE MEDICAL CARE IF:   You have a fever.  You are leaking fluid from your vagina.  You have spotting or bleeding from your vagina.  You have severe abdominal cramping or pain.  You have rapid weight gain or loss.  You vomit blood or material that looks like coffee grounds.  You are exposed to [place] measles and have never had them.  You are exposed to fifth disease or chickenpox.  You develop a severe headache.  You have shortness of breath.  You have any kind of trauma, such as from a fall or a car accident.   This information is not intended to replace advice given to you by your health care provider. Make sure you discuss any questions you have with your health care provider.   Document Released: 09/20/2001 Document Revised: 10/17/2014 Document Reviewed: 08/06/2013 Elsevier Interactive Patient Education [employer]2016 [employer] Inc.

## 2016-04-20 NOTE — ED Provider Notes (Signed)
CSN: [id number]     Arrival date & time 04/20/16  1552 History   First MD Initiated Contact with Patient 04/20/16 2042     Chief Complaint  Patient presents with  . Abdominal Pain  . Epistaxis     (Consider location/radiation/quality/duration/timing/severity/associated sxs/prior Treatment) HPI 20 year old female who presents with epistaxis and intermittent abdominal cramping. She is G2 P0 at about [redacted] weeks gestational age. She has had a official ultrasound documenting intrauterine pregnancy. States that over the past week she has had 2-3 episodes of mild epistaxis in the morning that resolved with pressure. Also states that over the past several days she has had intermittent abdominal cramping. No vaginal bleeding or abnormal vaginal discharge, nausea or vomiting, fevers, chills, flank pain, dysuria or urinary frequency. Past Medical History  Diagnosis Date  . Asthma   . Chronic bronchitis (HCC)   . Other allergic rhinitis    History reviewed. No pertinent past surgical history. History reviewed. No pertinent family history. Social History  Substance Use Topics  . Smoking status: Never Smoker   . Smokeless tobacco: None  . Alcohol Use: No   OB History    No data available     Review of Systems 10/14 systems reviewed and are negative other than those stated in the HPI    Allergies  Amoxicillin and Penicillins  Home Medications   Prior to Admission medications   Medication Sig Start Date End Date Taking? Authorizing Provider  albuterol (PROVENTIL HFA;VENTOLIN HFA) 108 (90 BASE) MCG/ACT inhaler Inhale into the lungs every 6 (six) hours as needed for wheezing or shortness of breath.    Historical Provider, MD  albuterol (PROVENTIL) (2.5 MG/3ML) 0.083% nebulizer solution Take 3 mLs (2.5 mg total) by nebulization every 6 (six) hours as needed for wheezing or shortness of breath. 06/10/14   Earley Favor, NP  beclomethasone (QVAR) 80 MCG/ACT inhaler Inhale 2 puffs into the lungs 2  (two) times daily.    Historical Provider, MD  budesonide-formoterol (SYMBICORT) 160-4.5 MCG/ACT inhaler Inhale 2 puffs into the lungs 2 (two) times daily.    Historical Provider, MD  cetirizine (ZYRTEC) 10 MG tablet Take 10 mg by mouth daily.    Historical Provider, MD  fexofenadine (ALLEGRA) 180 MG tablet Take 180 mg by mouth daily. 06/01/15   Historical Provider, MD  ibuprofen (ADVIL,MOTRIN) 600 MG tablet Take 1 tablet (600 mg total) by mouth every 8 (eight) hours as needed. Patient taking differently: Take 600 mg by mouth every 8 (eight) hours as needed for moderate pain.  06/10/14   Earley Favor, NP  Ipratropium-Albuterol (COMBIVENT RESPIMAT) 20-100 MCG/ACT AERS respimat Inhale 1 puff into the lungs every 6 (six) hours as needed for wheezing.    Historical Provider, MD  levocetirizine (XYZAL) 5 MG tablet Take 1 tablet (5 mg total) by mouth every evening. 01/06/16   Cristal Ford, MD  methocarbamol (ROBAXIN) 750 MG tablet Take 1 tablet (750 mg total) by mouth 3 (three) times daily. Patient not taking: Reported on 01/22/2015 06/10/14   Earley Favor, NP  mometasone-formoterol (DULERA) 200-5 MCG/ACT AERO Inhale 2 puffs into the lungs 2 (two) times daily. 12/01/15   Cristal Ford, MD  montelukast (SINGULAIR) 10 MG tablet Take 10 mg by mouth at bedtime.    Historical Provider, MD  ondansetron (ZOFRAN ODT) 4 MG disintegrating tablet Take 1 tablet (4 mg total) by mouth every 8 (eight) hours as needed for nausea or vomiting. Patient not taking: Reported on 08/11/2015 01/22/15   Lorin Picket  Zackowski, MD  predniSONE (DELTASONE) 10 MG tablet Prednisone 10 mg daily for 5 days 01/06/16   Cristal Ford, MD  tranexamic acid (LYSTEDA) 650 MG TABS tablet TAKE 2 TABLETS BY MOUTH 3 TIMES A DAY DURING MENSES FOR MAX OF 5 DAYS 05/07/15   Historical Provider, MD   BP 124/79 mmHg  Pulse 78  Temp(Src) 98.7 F (37.1 C) (Oral)  Resp 18  SpO2 100%  LMP 02/05/2016 Physical Exam Physical Exam  Nursing note and  vitals reviewed. Constitutional: Well developed, well nourished, non-toxic, and in no acute distress Head: Normocephalic and atraumatic.  Mouth/Throat: Oropharynx is clear and moist.  Nose: No bleeding. Clear bilateral nostrils. Neck: Normal range of motion. Neck supple.  Cardiovascular: Normal rate and regular rhythm.   Pulmonary/Chest: Effort normal and breath sounds normal.  Abdominal: Soft. There is mild suprapubic tenderness. There is no rebound and no guarding.  Musculoskeletal: Normal range of motion.  Neurological: Alert, no facial droop, fluent speech, moves all extremities symmetrically Skin: Skin is warm and dry.  Psychiatric: Cooperative  ED Course  Procedures (including critical care time) Labs Review Labs Reviewed  COMPREHENSIVE METABOLIC PANEL - Abnormal; Notable for the following:    Sodium 132 (*)    CO2 20 (*)    BUN <5 (*)    Albumin 3.3 (*)    ALT 9 (*)    All other components within normal limits  CBC - Abnormal; Notable for the following:    Hemoglobin 11.2 (*)    HCT 35.1 (*)    MCV 75.2 (*)    MCH 24.0 (*)    RDW 15.9 (*)    All other components within normal limits  URINALYSIS, ROUTINE W REFLEX MICROSCOPIC (NOT AT [facility]) - Abnormal; Notable for the following:    Ketones, ur 15 (*)    All other components within normal limits  I-STAT BETA HCG BLOOD, ED ([facility], [facility], [facility] ONLY) - Abnormal; Notable for the following:    I-stat hCG, quantitative >2000.0 (*)    All other components within normal limits    Imaging Review No results found. I have personally reviewed and evaluated these images and lab results as part of my medical decision-making.   EKG Interpretation None       EMERGENCY DEPARTMENT [place] PREGNANCY "Study: Limited Ultrasound of the Pelvis"  INDICATIONS:Pregnancy(required) Multiple views of the uterus and pelvic cavity are obtained with a multi-frequency probe.  APPROACH:Transabdominal   PERFORMED BY: Myself  IMAGES ARCHIVED?:  Yes  LIMITATIONS: Decompressed bladder  PREGNANCY FREE FLUID: None  PREGNANCY UTERUS FINDINGS:Uterus enlarged and Gestational sac noted ADNEXAL FINDINGS: N/A  PREGNANCY FINDINGS: Intrauterine gestational sac noted and Fetal heart activity seen  INTERPRETATION: Viable intrauterine pregnancy  GESTATIONAL AGE, ESTIMATE: N/A  FETAL HEART RATE:  120  COMMENT(Estimate of Gestational Age):  N/A     MDM   Final diagnoses:  None    Discussed continued supportive for epistaxis. Currently without any acute bleeding. No thrombocytopenia and stable hgb. History not suggestive of bleeding dyscrasia.   Has previous confirmed IUP with this pregnancy. No vaginal bleeding to suggest potential miscarriage. Bedside ultrasound reveals IUP with active fetal movements and fetal heart rate of 120. She does not want pelvic exam to be performed and expressed wishes to have this performed by her OB physician. No STD concerns either. UA normal.   Appropriate for discharge for continued follow-up with [facility] physician as scheduled. Strict return and follow-up instructions reviewed. She expressed understanding of all discharge  instructions and felt comfortable with the plan of care.    Lavera Guiseana Duo Angeligue Bowne, MD 04/20/16 2226

## 2016-04-20 NOTE — ED Notes (Addendum)
She c/o several week history of lower abd cramps and nosebleeds. She is [redacted] weeks pregnant and wants to make sure the baby is okay. She denies vaginal discharge, bleeding. She reports nausea, intermittent fevers. She has not seen OBGYN for this pregnancy. She is alert and breathing easily

## 2016-05-23 ENCOUNTER — Telehealth: Payer: Self-pay | Admitting: *Deleted

## 2016-05-23 NOTE — Telephone Encounter (Signed)
PT CALLED REGARDING HER BILL FOR INJECTIONS.
